# Patient Record
Sex: Female | Born: 1955 | Race: White | Hispanic: No | Marital: Married
Health system: Southern US, Community
[De-identification: ages and names within clinical notes are randomized; demographics above are authoritative.]

---

## 1997-11-08 ENCOUNTER — Other Ambulatory Visit: Admission: RE | Admit: 1997-11-08 | Discharge: 1997-11-08 | Payer: Self-pay | Admitting: Obstetrics and Gynecology

## 1998-11-19 ENCOUNTER — Other Ambulatory Visit: Admission: RE | Admit: 1998-11-19 | Discharge: 1998-11-19 | Payer: Self-pay | Admitting: Obstetrics and Gynecology

## 1999-05-30 ENCOUNTER — Encounter: Payer: Self-pay | Admitting: Family Medicine

## 1999-05-30 ENCOUNTER — Encounter: Admission: RE | Admit: 1999-05-30 | Discharge: 1999-05-30 | Payer: Self-pay | Admitting: Family Medicine

## 1999-11-26 ENCOUNTER — Other Ambulatory Visit: Admission: RE | Admit: 1999-11-26 | Discharge: 1999-11-26 | Payer: Self-pay | Admitting: Obstetrics and Gynecology

## 2001-01-20 ENCOUNTER — Other Ambulatory Visit: Admission: RE | Admit: 2001-01-20 | Discharge: 2001-01-20 | Payer: Self-pay | Admitting: Obstetrics and Gynecology

## 2002-02-06 ENCOUNTER — Other Ambulatory Visit: Admission: RE | Admit: 2002-02-06 | Discharge: 2002-02-06 | Payer: Self-pay | Admitting: Obstetrics and Gynecology

## 2003-04-05 ENCOUNTER — Other Ambulatory Visit: Admission: RE | Admit: 2003-04-05 | Discharge: 2003-04-05 | Payer: Self-pay | Admitting: Obstetrics and Gynecology

## 2004-07-25 ENCOUNTER — Other Ambulatory Visit: Admission: RE | Admit: 2004-07-25 | Discharge: 2004-07-25 | Payer: Self-pay | Admitting: Obstetrics and Gynecology

## 2007-11-11 ENCOUNTER — Encounter (INDEPENDENT_AMBULATORY_CARE_PROVIDER_SITE_OTHER): Payer: Self-pay | Admitting: Obstetrics and Gynecology

## 2007-11-11 ENCOUNTER — Ambulatory Visit (HOSPITAL_COMMUNITY): Admission: RE | Admit: 2007-11-11 | Discharge: 2007-11-11 | Payer: Self-pay | Admitting: Obstetrics and Gynecology

## 2008-12-20 ENCOUNTER — Encounter: Payer: Self-pay | Admitting: Internal Medicine

## 2009-10-14 ENCOUNTER — Encounter: Payer: Self-pay | Admitting: Internal Medicine

## 2009-10-17 ENCOUNTER — Ambulatory Visit: Payer: Self-pay | Admitting: Cardiology

## 2009-10-30 ENCOUNTER — Encounter: Payer: Self-pay | Admitting: Internal Medicine

## 2010-03-06 ENCOUNTER — Encounter: Payer: Self-pay | Admitting: Internal Medicine

## 2010-03-21 ENCOUNTER — Institutional Professional Consult (permissible substitution) (INDEPENDENT_AMBULATORY_CARE_PROVIDER_SITE_OTHER): Payer: PRIVATE HEALTH INSURANCE | Admitting: Internal Medicine

## 2010-03-21 ENCOUNTER — Encounter: Payer: Self-pay | Admitting: Internal Medicine

## 2010-03-21 DIAGNOSIS — D869 Sarcoidosis, unspecified: Secondary | ICD-10-CM | POA: Insufficient documentation

## 2010-03-23 DIAGNOSIS — E039 Hypothyroidism, unspecified: Secondary | ICD-10-CM | POA: Insufficient documentation

## 2010-04-01 NOTE — Assessment & Plan Note (Signed)
Summary: ? sarcodosis/per dr Theron Arista jordan/cb   Copy to:  Amy Swaziland, MD/ Derm Primary Provider/Referring Provider:  Doristine Counter  CC:  Pulmonary consult-? sarcoidosis; Amy Swaziland.Marland Kitchen  History of Present Illness: March 21, 2010- 55 yoF seen in pulmonary referral by Dr Swaziland concerned about possibility of systemic sarcoid. Mrs. Riera has been in good health. in the last year she has had some small periorbital skin nodules. A lesion bx'd from the side of her nose 10/14/09 was read as granulomatous dermatitis, compatible with sarcoid. Special stains were negative for organisms. another from the left eyelid on 12/21/08. CXR 10/30/09 was negative. ACE level 10/31/09- WNL, 25 (25-73). She notes occasional night sweats she can't distinguish from hormonal, but denies cough, fever, nodes, other rash or weight loss. There has been some mild symmetrical stiffness in her hands, but no heat or erythema. There is no prior hx of lung or liver disease, other than a minor AST elevation .Marland Kitchen She has had some travel in  [place] but not in the [employer] area.  No known TB exposure- PPD was negative years ago.   Preventive Screening-Counseling & Management  Alcohol-Tobacco     Smoking Status: never     Passive Smoke Exposure: yes  Current Medications (verified): 1)  Metoprolol Tartrate 50 Mg Tabs (Metoprolol Tartrate) .... Take 1 By Mouth Once Daily 2)  Levothroid 50 Mcg Tabs (Levothyroxine Sodium) .... Take 1 By Mouth Once Daily 3)  Caltrate 600+d 600-400 Mg-Unit Tabs (Calcium Carbonate-Vitamin D) .... Take 1 By Mouth Two Times A Day 4)  Triflex Sport .... Take 1 By Mouth Once Daily  Allergies (verified): No Known Drug Allergies  Past History:  Past Medical History: Granulomatous dermatitis c/w sarcoid 2011 Hypothyroidism PVCs/ metoprolol  Past Surgical History: GYN- LEEP procedure, D&C  Family History: Mother-asthma-, PHX smoking Father-Heart Disease. Liver cancer(HCC)-LIVING EX  SMOKER Brother-MS-DECEASED  Social History: Married;children, homemaker non smoker ETOH-social weekend use. Positive history of passive tobacco smoke exposure.  Smoking Status:  never Passive Smoke Exposure:  yes  Review of Systems      See HPI       The patient complains of sore throat.  The patient denies shortness of breath with activity, shortness of breath at rest, productive cough, non-productive cough, coughing up blood, chest pain, irregular heartbeats, acid heartburn, indigestion, loss of appetite, weight change, abdominal pain, difficulty swallowing, tooth/dental problems, headaches, nasal congestion/difficulty breathing through nose, sneezing, itching, ear ache, anxiety, depression, hand/feet swelling, joint stiffness or pain, rash, change in color of mucus, and fever.    Vital Signs:  Patient profile:   55 year old female Height:      63 inches Weight:      133.38 pounds BMI:     23.71 O2 Sat:      100 % on Room air Pulse rate:   57 / minute BP sitting:   138 / 86  (right arm) Cuff size:   regular  Vitals Entered By: Reynaldo Minium CMA (March 21, 2010 1:59 PM)  O2 Flow:  Room air CC: Pulmonary consult-? sarcoidosis; Amy Swaziland.   Physical Exam  Additional Exam:  General: A/Ox3; pleasant and cooperative, NAD, wdwn, comfortable -appearing SKIN: no rash, lesions. Minor cutaneous nodules on her eyelids had to be pointed out to me.  NODES: no lymphadenopathy HEENT: [place]/AT, EOM- WNL, Conjuctivae- clear, PERRLA, TM-WNL, Nose- turbinate edema, Throat- clear and wnl, Mallampati  III NECK: Supple w/ fair ROM, JVD- none, normal carotid impulses w/o  bruits Thyroid- normal to palpation CHEST: Clear to P&A HEART: RRR, no m/g/r heard ABDOMEN: Soft and nl; nml bowel sounds; no organomegaly or masses noted ZOX:WRUE, nl pulses, no edema  NEURO: Grossly intact to observation      Impression & Recommendations:  Problem # 1:  SARCOIDOSIS (ICD-135) Question of cutaneous  sarcoid based on bx. Sero negative. She asks if gyn bx from previous procedure would prove  to be the same process. She has no symptoms. I will suggest that Dr Swaziland might coordinate pathology comparison of her skin and gyn biopsies. Dr Doristine Counter might choose to repeat a CXR and ACE once in a year. Otherwise no intervention is indicated.   Medications Added to Medication List This Visit: 1)  Metoprolol Tartrate 50 Mg Tabs (Metoprolol tartrate) .... Take 1 by mouth once daily 2)  Levothroid 50 Mcg Tabs (Levothyroxine sodium) .... Take 1 by mouth once daily 3)  Caltrate 600+d 600-400 Mg-unit Tabs (Calcium carbonate-vitamin d) .... Take 1 by mouth two times a day 4)  Triflex Sport  .... Take 1 by mouth once daily  Other Orders: Consultation Level III (45409)  Patient Instructions: 1)  Please schedule a follow-up appointment as needed. 2)  cc Dr Swaziland, Dr Doristine Counter

## 2010-04-01 NOTE — Letter (Signed)
Summary: [facility]   Imported By: Sherian Rein 03/26/2010 13:54:17  _____________________________________________________________________  External Attachment:    Type:   Image     Comment:   External Document

## 2010-04-29 ENCOUNTER — Other Ambulatory Visit: Payer: Self-pay | Admitting: Cardiology

## 2010-04-29 DIAGNOSIS — E039 Hypothyroidism, unspecified: Secondary | ICD-10-CM

## 2010-05-16 ENCOUNTER — Other Ambulatory Visit: Payer: Self-pay | Admitting: *Deleted

## 2010-05-16 DIAGNOSIS — E039 Hypothyroidism, unspecified: Secondary | ICD-10-CM

## 2010-05-16 MED ORDER — LEVOTHYROXINE SODIUM 50 MCG PO TABS: 50.0000 ug | ORAL_TABLET | Freq: Every day | ORAL | Status: DC

## 2010-06-03 NOTE — Op Note (Signed)
[name], Jody Sullivan               ACCOUNT NO.:  [id number]   MEDICAL RECORD NO.:  [id number]          PATIENT TYPE:  AMB   LOCATION:  SDC                           FACILITY:  [facility]   PHYSICIAN:  Juluis Mire, M.D.   DATE OF BIRTH:  [birth year]   DATE OF PROCEDURE:  11/11/2007  DATE OF DISCHARGE:                               OPERATIVE REPORT   PREOPERATIVE DIAGNOSES:  1. Endometrial polyp.  2. Cervical dysplasia.   POSTOPERATIVE DIAGNOSES:  1. Endometrial polyp.  2. Cervical dysplasia.   PROCEDURES:  Hysteroscopy with resection of submucosal fibroid.  Endometrial curettings.  Loop electrical excision procedure of the  cervix.   SURGEON:  Juluis Mire, MD   ANESTHESIA:  General.   ESTIMATED BLOOD LOSS:  Minimal.   PACKS AND DRAINS:  None.   INTRAOPERATIVE BLOOD PLACED:  None.   COMPLICATIONS:  None.   INDICATIONS:  Are as dictated in the history and physical.   PROCEDURE:  The patient was taken to the OR and placed in supine  position.  After satisfactory level of general anesthesia was obtained,  the patient was placed in the dorsal lithotomy position using Allen  stirrups.  Perineum and vagina prepped out with Betadine and draped in  sterile field.  Speculum was placed in the vaginal vault.  Cervix was  grasped with single-tooth tenaculum.  Paracervical block was instituted  using 1% Nesacaine.  Uterus sounded to 8 cm.  Cervix was serially  dilated to a size 29 Pratt dilator.  Hysteroscope was introduced,  uterine cavity revealed a submucosal fibroid in the anterior wall.  This  was resected completely and sent for pathology.  We were able to resect  almost all of it.  Total deficit was 45 mL.  There was no active  bleeding or signs of perforation.  We then did endometrial curettings  and these were sent for pathology.  At this point in time, the single-  tooth tenaculum and speculum were then taken out.  The LEEP speculum was  put in place.  We then injected the  cervix with 1% Xylocaine with  epinephrine.  We did a wide, but shallow loop excision and this was sent  to pathology.  We then cauterized the base and applied Monsel's.  We had  good hemostasis and no active bleeding.  At this point in time,  the speculum was taken out.  The patient was taken out of the dorsal  lithotomy position and once alert and extubated, transferred to recovery  room in good condition.  Sponge, instruments, and needle count was  reported correct by circulating nurse x2.      Juluis Mire, M.D.  Electronically Signed     JSM/MEDQ  D:  11/11/2007  T:  11/12/2007  Job:  [id number]

## 2010-10-21 LAB — HCG, SERUM, QUALITATIVE: Preg, Serum: NEGATIVE

## 2010-10-21 LAB — CBC
Hemoglobin: 13.5
RBC: 4.14

## 2010-11-29 ENCOUNTER — Other Ambulatory Visit: Payer: Self-pay | Admitting: Cardiology

## 2011-05-04 ENCOUNTER — Other Ambulatory Visit: Payer: Self-pay | Admitting: Specialist

## 2011-05-04 ENCOUNTER — Ambulatory Visit
Admission: RE | Admit: 2011-05-04 | Discharge: 2011-05-04 | Disposition: A | Discharge status: Home / Self Care | Payer: PRIVATE HEALTH INSURANCE | Source: Ambulatory Visit | Attending: Specialist | Admitting: Specialist

## 2011-05-04 DIAGNOSIS — R52 Pain, unspecified: Secondary | ICD-10-CM

## 2011-05-04 DIAGNOSIS — M25519 Pain in unspecified shoulder: Secondary | ICD-10-CM

## 2011-12-27 ENCOUNTER — Other Ambulatory Visit: Payer: Self-pay | Admitting: Nurse Practitioner

## 2013-02-22 ENCOUNTER — Encounter: Payer: Self-pay | Admitting: Cardiology

## 2013-11-02 ENCOUNTER — Other Ambulatory Visit: Payer: Self-pay | Admitting: Obstetrics and Gynecology

## 2013-11-03 LAB — CYTOLOGY - PAP

## 2017-05-11 ENCOUNTER — Other Ambulatory Visit: Payer: Self-pay | Admitting: Family Medicine

## 2017-05-11 ENCOUNTER — Ambulatory Visit
Admission: RE | Admit: 2017-05-11 | Discharge: 2017-05-11 | Disposition: A | Discharge status: Home / Self Care | Payer: Commercial Managed Care - PPO | Source: Ambulatory Visit | Attending: Family Medicine | Admitting: Family Medicine

## 2017-05-11 DIAGNOSIS — R0602 Shortness of breath: Secondary | ICD-10-CM

## 2018-02-28 ENCOUNTER — Ambulatory Visit: Payer: Commercial Managed Care - PPO | Admitting: Sports Medicine

## 2018-02-28 VITALS — BP 132/80 | Ht 63.0 in | Wt 130.0 lb

## 2018-02-28 DIAGNOSIS — M940 Chondrocostal junction syndrome [Tietze]: Secondary | ICD-10-CM | POA: Diagnosis not present

## 2018-02-28 DIAGNOSIS — S161XXA Strain of muscle, fascia and tendon at neck level, initial encounter: Secondary | ICD-10-CM

## 2018-02-28 NOTE — Progress Notes (Signed)
  Jody Sullivan - 63 y.o. female MRN [id number]  Date of birth: 07-May-1955  SUBJECTIVE:  Including CC & ROS.  Jody Sullivan is a 63 year old female presenting for chronic rib pain and shoulder pain.  She was referred by her PT, [place] who she sees for dry needling.  Rib pain: Chronic over the last year.  Had pneumonia back in April which exacerbated her rib pain which has improved.  She continues to have a soreness sensation throughout her rib cage including under her breasts bilaterally and near her thoracic spine with a 1/10 severity.  Pain is exacerbated with movement and not worse with exertion or improved with rest.  She occasionally takes Advil with some improvement.  She was told she had costochondritis last year following her pneumonia.  She denies chest pain or shortness of breath.  Shoulder pain: Patient reports burning sensation localized to bilateral shoulders which does not radiate to upper extremities.  She felt her pain was exacerbated following massage last week where she also receives dry needling.  She denies weakness to her upper extremities or change in sensation.  She has no difficulty with cervical rotation.  She is reported increased stress in her life with the passing of her mother back in September after caring for her over the last 3 years.  She also gave up Pilates and tennis but is hopeful to be active again.  Patient has been hesitant to increase activity level due to uncertainty regarding source of pain.  She does have some improvement with Advil and cold compress.    HISTORY: Past Medical, Surgical, Social, and Family History Reviewed & Updated per EMR.   Pertinent Historical Findings include: History of hypothyroidism.  No surgical history.  DATA REVIEWED: No history of back imaging.  PHYSICAL EXAM:  VS: BP:132/80  HR: bpm  TEMP: ( )  RESP:   HT:5\' 3"  (160 cm)   WT:130 lb (59 kg)  BMI:23.03 PHYSICAL EXAM: General: well nourished, well developed, NAD with non-toxic  appearance HEENT: normocephalic, atraumatic, moist mucous membranes Neck: passive and active ROM intact, negative Spurling test, moderate tenderness localized to trapezius bilaterally Skin: warm, dry, no rashes or lesions, cap refill < 2 seconds Extremities: warm and well perfused, normal tone, no edema MSK: 5/5 motor strength on upper extremities bilaterally, neurovascular intact, no central spine tenderness, mild tenderness to thoracic region and diffusely throughout rib cage  ASSESSMENT & PLAN: See problem based charting & AVS for pt instructions.  Costochondritis Chronic.  Has history of work-up for sarcoidosis and history of pneumonia.  Not consistent with ACS.  Given multiple locations and exacerbation with movement only, symptoms do sound consistent with costochondritis. - Ambulatory referral to PT with Jen placed - Discussed importance of physical exercise and other conservative management - Reviewed return precautions, RTC as needed  Cervical strain Chronic.  Bilateral involvement.  No signs of nerve impingement.  Likely secondary to poor ergonomics and increased stress. - Discussed importance of exercise and other means of conservative therapy - Patient to continue seeing Karin Golden for dry needling and massage - See plan for costochondritis

## 2018-02-28 NOTE — Assessment & Plan Note (Signed)
Chronic.  Bilateral involvement.  No signs of nerve impingement.  Likely secondary to poor ergonomics and increased stress. - Discussed importance of exercise and other means of conservative therapy - Patient to continue seeing Karin Golden for dry needling and massage - See plan for costochondritis

## 2018-02-28 NOTE — Patient Instructions (Signed)
Thank you for coming in to see [place] today. Please see below to review our plan for today's visit.  I do believe you have 2 problems going on.  The first regarding your ribs is secondary to costochondritis which is a chronic inflammatory issue which causes pain and usually occurs in flares.  We will get you set up with Candise Bowens to begin physical therapy and Pilates.  I encourage you to try Advil 500 mg twice daily as needed for severe exacerbations, otherwise remaining active will be your best friend.  You also appear to be having trouble with trapezius strain which is a muscle body on your shoulders and extends to your neck, upper back, and back of scalp.  This is a common area to carry stress and usually is exacerbated with poor posture.  This is another reason for you to get active again.  I would encourage you to pick up tennis and find other means to be active.  The Advil which I recommended for your costochondritis will also help with this.  Please follow-up with [place] if your symptoms are worsening.  Durward Parcel, DO [facility], PGY-3

## 2018-02-28 NOTE — Assessment & Plan Note (Signed)
Chronic.  Has history of work-up for sarcoidosis and history of pneumonia.  Not consistent with ACS.  Given multiple locations and exacerbation with movement only, symptoms do sound consistent with costochondritis. - Ambulatory referral to PT with Jen placed - Discussed importance of physical exercise and other conservative management - Reviewed return precautions, RTC as needed

## 2018-03-03 ENCOUNTER — Other Ambulatory Visit: Payer: Self-pay

## 2018-03-03 ENCOUNTER — Ambulatory Visit: Payer: Commercial Managed Care - PPO | Attending: Sports Medicine | Admitting: Physical Therapy

## 2018-03-03 ENCOUNTER — Encounter: Payer: Self-pay | Admitting: Physical Therapy

## 2018-03-03 DIAGNOSIS — R252 Cramp and spasm: Secondary | ICD-10-CM | POA: Diagnosis present

## 2018-03-03 DIAGNOSIS — M542 Cervicalgia: Secondary | ICD-10-CM | POA: Diagnosis not present

## 2018-03-03 NOTE — Therapy (Signed)
[facility] [address] [place], [place], [address] Phone: [phone]   Fax:  [phone]  Physical Therapy Evaluation  Patient Details  Name: Jody Sullivan MRN: [id number] Date of Birth: Mar 27, 1955 Referring Provider (PT): Dr  Reino Bellis    Encounter Date: 03/03/2018  PT End of Session - 03/03/18 1316    Visit Number  1    Number of Visits  12    PT Start Time  1320    PT Stop Time  1410    PT Time Calculation (min)  50 min    Activity Tolerance  Patient tolerated treatment well    Behavior During Therapy  [facility] for tasks assessed/performed       History reviewed. No pertinent past medical history.  History reviewed. No pertinent surgical history.  There were no vitals filed for this visit.   Subjective Assessment - 03/03/18 1315    Subjective  Patient reports with midback pain which has been ongoing since April 2019.  She had pneumonia and costochondritis.  She had previously been active but took 3 yrs off from her normal exercises and she thought that may have been part of it.  She has seen a PT on and off ([facility]) through the years.   She has had increase in posterior neck pain about 1 month ago.  She has burning in her upper back/neck  and difficulty lifting, reaching out.  She denies trauma or an incident.  It has been more painful since a treatment end of Jan.. Overall the pain is better but she would like to get rid of it once and for all.      Pertinent History  pneumonia, costochondritis, CTS surgery L hand.  Recent loss of parent.     Limitations  Reading;Sitting;Lifting;House hold activities   overly cautious   Diagnostic tests  none     Patient Stated Goals  to get rid of pain, I would to not be sore at all     Currently in Pain?  Yes    Pain Score  1    today is a good day, was 6/10 last week)    Pain Location  Neck    Pain Orientation  Right;Left;Posterior    Pain Descriptors / Indicators   Burning;Tightness    Pain Type  Chronic pain;Acute pain   acute on chronic    Pain Radiating Towards  across bilateral shoulders     Pain Onset  More than a month ago    Pain Frequency  Intermittent    Aggravating Factors   stress, tension , overactivity, to a degree deep, painful massage has been a trigger     Pain Relieving Factors  ice, heat, meds OTC    Effect of Pain on Daily Activities  feels creaky, limits her comfort     Multiple Pain Sites  No         [facility] PT Assessment - 03/03/18 0001      Assessment   Medical Diagnosis  cervical strain     Referring Provider (PT)  Dr  Reino Bellis     Onset Date/Surgical Date  --   acute    Hand Dominance  Right    Prior Therapy  Yes       Precautions   Precautions  None      Restrictions   Weight Bearing Restrictions  No      Balance Screen   Has the patient fallen in the past  6 months  No      Home Public house managernvironment   Living Environment  Private residence    Living Arrangements  Spouse/significant other      Prior Function   Level of Independence  Independent    Vocation  Retired    GafferVocation Requirements  has not worked, was a Corporate treasurerCPA long ago     Leisure  walk for fitness, nature, relaxing, music, being with family       Cognition   Overall Cognitive Status  Within Functional Limits for tasks assessed      Observation/Other Assessments   Focus on Therapeutic Outcomes (FOTO)   39%      Sensation   Light Touch  Appears Intact    Additional Comments  rare numbness L hand      Coordination   Gross Motor Movements are Fluid and Coordinated  Not tested      Posture/Postural Control   Posture/Postural Control  Postural limitations    Postural Limitations  Rounded Shoulders;Forward head;Decreased thoracic kyphosis    Posture Comments  mild FWHP, low Rt shoulder      AROM   Overall AROM Comments  UE WFL     Cervical Flexion  50    Cervical Extension  70    Cervical - Right Side Bend  50    Cervical - Left Side Bend  55     Cervical - Right Rotation  WFL    Cervical - Left Rotation  [facility]       Strength   Overall Strength Comments  UE shoulder flexion and abd 4+/5       Palpation   Palpation comment  pain along bilateral suboccipitals, sore in bilateral upper trap, levator scapula and mid back /rhomboids, extending into Thoracolumbar area       Transfers   Comments  WNL       Ambulation/Gait   Gait Comments  WNL                 Objective measurements completed on examination: See above findings.      [facility] Treatment/Exercise - 03/03/18 0001      Self-Care   Self-Care  Posture;Heat/Ice Application;Other Self-Care Comments    Posture  FW head posture    Other Self-Care Comments   HEP, relaxation and tension, eval findings, POC       Neck Exercises: Standing   Neck Retraction  10 reps      Lumbar Exercises: Quadruped   Madcat/Old Horse  5 reps    Madcat/Old Horse Limitations  then "wags" for sidebending     Other Quadruped Lumbar Exercises  Mermaid x 2              PT Education - 03/03/18 1917    Education Details  PT/POC , HEP, posture, stabilization, mobility of spine     Person(s) Educated  Patient    Methods  Explanation;Demonstration;Handout    Comprehension  Verbalized understanding;Returned demonstration          PT Long Term Goals - 03/03/18 1918      PT LONG TERM GOAL #1   Title  Pt will be able to improve FOTO score by 10%     Baseline  39%    Time  6    Period  Weeks    Status  New    Target Date  04/14/18      PT LONG TERM GOAL #2   Title  Pt will  be I with HEP upon discharge for posture and reducing cervical tension    Time  6    Period  Weeks    Status  New    Target Date  04/14/18      PT LONG TERM GOAL #3   Title  Pt will be able to complete ADLs, housework without increasing neck pain, including lifting moderate sized items     Time  6    Period  Weeks    Status  New    Target Date  04/14/18      PT LONG TERM GOAL #4   Title   Pt will identify techniques for improving relaxation and stress management    Time  6    Period  Weeks    Status  New    Target Date  04/14/18      PT LONG TERM GOAL #5   Title  Pt will be able to move with ease, less apprehension about transitional movements and head turns.     Time  6    Period  Weeks    Status  New    Target Date  04/14/18      Additional Long Term Goals   Additional Long Term Goals  Yes      PT LONG TERM GOAL #6   Title  Pt will transition to structured fitness program without increasing neck pain.     Time  6    Period  Weeks    Status  New    Target Date  04/14/18             Plan - 03/03/18 1919    Clinical Impression Statement  Patient presents for mod complexity eval of acute on chronic neck pain.  Her symptoms are consistent with upper crossed syndrome, strain induced by stress an prolonged periods of sitting, driving in the car.  She is not interested in continuing dry needling right now as she feels it may have aggravated her neck pain about 2 weeks ago.  She should do very well and be able to transition to community fitness with resolution of symptoms.     History and Personal Factors relevant to plan of care:  back pain chronic, loss of parent(s) recently, anxiety and fear related to increasing pain and limitations of activity     Clinical Presentation  Evolving    Clinical Presentation due to:  changing symptoms, variable in location and intensity     Clinical Decision Making  Moderate    Rehab Potential  Excellent    PT Frequency  2x / week    PT Duration  6 weeks    PT Treatment/Interventions  ADLs/Self Care Home Management;Moist Heat;Therapeutic activities;Therapeutic exercise;Ultrasound;Manual techniques;Taping;Neuromuscular re-education;Cryotherapy;Electrical Stimulation;Functional mobility training;Patient/family education;Passive range of motion    PT Next Visit Plan  check HEP, manual to neck, upper back, try foam roller, eventually  inroduce to Reformer, include rotation for thoracic spine    PT Home Exercise Plan  cat/camel, wags, upper trap stretch and chin tuck.     Consulted and Agree with Plan of Care  Patient       Patient will benefit from skilled therapeutic intervention in order to improve the following deficits and impairments:  Hypomobility, Impaired sensation, Decreased strength, Increased fascial restricitons, Impaired UE functional use, Pain, Increased muscle spasms, Postural dysfunction, Decreased range of motion  Visit Diagnosis: Cervicalgia - Plan: PT plan of care cert/re-cert  Cramp and spasm - Plan: PT plan of  care cert/re-cert     Problem List Patient Active Problem List   Diagnosis Date Noted  . Costochondritis 02/28/2018  . Cervical strain 02/28/2018  . HYPOTHYROIDISM 03/23/2010    PAA,JENNIFER 03/03/2018, 7:35 PM  [facility] [address] [place], [place], [address] Phone: [phone]   Fax:  [phone]  Name: Charnel Wolanin MRN: [id number] Date of Birth: 1955/08/02    Karie Mainland, PT 03/03/18 7:35 PM Phone: [phone] Fax: [phone]

## 2018-03-03 NOTE — Patient Instructions (Signed)
Step 1  Step 2  Cat-Camel reps: 10  sets: 1  hold: 10  daily: 1  weekly: 7 Setup  Begin on all fours with your arms directly under your shoulders and knees bent 90 degrees. Movement  Slowly round your back up toward the ceiling, then let it sag down to the floor while looking up, and repeat.  Tip  Make sure to use your entire back for the motion and keep your movements slow and controlled. Step 1  Step 2  Step 3  Step 4  Mermaid reps: 10  sets: 2  hold: 10  daily: 1  weekly: 7 Setup  Begin sitting upright with your legs bent, one out to your side and one in front of you. Movement  Inhale as you lift your arm overhead, exhale as you sidebend into a long C-shape position, sweeping your top arm overhead. Inhale as you return to an upright seated position and exhale as you lower your hand down, and repeat to the opposite side. Repeat the exercise a second time with your legs facing the other direction. Tip  Make sure to keep your abdominals engaged and movements fluid. Do not lean your trunk forward or backward as you sidebend. You can sit on a block or towel as needed. Step 1  Step 2  Supine Chin Tuck reps: 10  sets: 1  hold: 5  daily: 1  weekly: 7 Setup  Begin lying on your back with your neck relaxed. Movement  Gently tuck your chin directly backward as if you are making a double chin. Hold, then relax and repeat. Tip  Make sure not to lift your head from the ground.

## 2018-03-09 ENCOUNTER — Ambulatory Visit: Payer: Commercial Managed Care - PPO | Admitting: Physical Therapy

## 2018-03-09 ENCOUNTER — Encounter: Payer: Self-pay | Admitting: Physical Therapy

## 2018-03-09 DIAGNOSIS — M542 Cervicalgia: Secondary | ICD-10-CM

## 2018-03-09 DIAGNOSIS — R252 Cramp and spasm: Secondary | ICD-10-CM

## 2018-03-09 NOTE — Therapy (Signed)
[facility] Outpatient Rehabilitation [facility] [address] [place], [place], [address] Phone: [phone]   Fax:  [phone]  Physical Therapy Treatment  Patient Details  Name: Jody Sullivan MRN: [id number] Date of Birth: Jul 09, 1955 Referring Provider (PT): Dr  Reino Bellis    Encounter Date: 03/09/2018  PT End of Session - 03/09/18 1321    Visit Number  2    Number of Visits  12    PT Start Time  1102    PT Stop Time  1145    PT Time Calculation (min)  43 min    Activity Tolerance  Patient tolerated treatment well    Behavior During Therapy  [facility] for tasks assessed/performed       History reviewed. No pertinent past medical history.  History reviewed. No pertinent surgical history.  There were no vitals filed for this visit.  Subjective Assessment - 03/09/18 1103    Subjective  Pain with movement.  No pai at rest.   Exercises helpful.    Currently in Pain?  No/denies    Pain Location  Neck    Pain Orientation  Left    Pain Descriptors / Indicators  Burning;Tightness    Pain Type  Acute pain    Pain Radiating Towards  across shoulders      Aggravating Factors   lifting.  tightnes.      Pain Relieving Factors  ice,  heat,, OTC                       [facility] Adult PT Treatment/Exercise - 03/09/18 0001      Neck Exercises: Standing   Other Standing Exercises  chin tuch 3 X 10 seconds      Neck Exercises: Supine   Other Supine Exercise  decompression 2 minutes  shoulder press,  head press,  leg lengthener,  leg press 5 x 5 seconds each   these feel good cues.       Manual Therapy   Manual therapy comments  soft tissue work upper back periscalpular and neck in prone.  Tissue softened,  teres tender Left.        Neck Exercises: Stretches   Upper Trapezius Stretch  3 reps;10 seconds    Levator Stretch  Right;Left    Levator Stretch Limitations  standing    Corner Stretch  3 reps;30 seconds    Corner Stretch Limitations  Doorway  Both              PT Education - 03/09/18 1321    Education Details  HEP    Methods  Explanation;Tactile cues;Demonstration;Verbal cues    Comprehension  Returned demonstration;Verbalized understanding          PT Long Term Goals - 03/03/18 1918      PT LONG TERM GOAL #1   Title  Pt will be able to improve FOTO score by 10%     Baseline  39%    Time  6    Period  Weeks    Status  New    Target Date  04/14/18      PT LONG TERM GOAL #2   Title  Pt will be I with HEP upon discharge for posture and reducing cervical tension    Time  6    Period  Weeks    Status  New    Target Date  04/14/18      PT LONG TERM GOAL #3   Title  Pt will  be able to complete ADLs, housework without increasing neck pain, including lifting moderate sized items     Time  6    Period  Weeks    Status  New    Target Date  04/14/18      PT LONG TERM GOAL #4   Title  Pt will identify techniques for improving relaxation and stress management    Time  6    Period  Weeks    Status  New    Target Date  04/14/18      PT LONG TERM GOAL #5   Title  Pt will be able to move with ease, less apprehension about transitional movements and head turns.     Time  6    Period  Weeks    Status  New    Target Date  04/14/18      Additional Long Term Goals   Additional Long Term Goals  Yes      PT LONG TERM GOAL #6   Title  Pt will transition to structured fitness program without increasing neck pain.     Time  6    Period  Weeks    Status  New    Target Date  04/14/18            Plan - 03/09/18 1322    Clinical Impression Statement  Beginning exercises and manual helpful.  LT  periscapulat musculature  softened.  Patrient declined the mneed for mAODLAITES    PT Next Visit Plan  check HEP, manual to neck, upper back, try foam roller, eventually inroduce to Reformer, include rotation for thoracic spine    PT Home Exercise Plan  cat/camel, wags, upper trap stretch and chin tuck. Decompression     Consulted and Agree with Plan of Care  Patient       Patient will benefit from skilled therapeutic intervention in order to improve the following deficits and impairments:     Visit Diagnosis: Cervicalgia  Cramp and spasm     Problem List Patient Active Problem List   Diagnosis Date Noted  . Costochondritis 02/28/2018  . Cervical strain 02/28/2018  . HYPOTHYROIDISM 03/23/2010    [name]  PTA 03/09/2018, 1:25 PM  [facility] [address] [place], [place], [address] Phone: [phone]   Fax:  [phone]  Name: Jody Sullivan MRN: [id number] Date of Birth: 05/24/1955

## 2018-03-09 NOTE — Patient Instructions (Signed)
Decompression issued from exercise drawer All issued PRN 5 x each 5 seconds hold Decompression exercise 5 to 15 minutes

## 2018-03-10 NOTE — Therapy (Signed)
[facility] Outpatient Rehabilitation [facility] [address] [place], [place], [address] Phone: [phone]   Fax:  [phone]  Physical Therapy Treatment  Patient Details  Name: Jody Sullivan MRN: [id number] Date of Birth: [birth year] Referring Provider (PT): Dr  Reino Bellis    Encounter Date: 03/09/2018  PT End of Session - 03/09/18 1321    Visit Number  2    Number of Visits  12    PT Start Time  1102    PT Stop Time  1145    PT Time Calculation (min)  43 min    Activity Tolerance  Patient tolerated treatment well    Behavior During Therapy  [facility] for tasks assessed/performed       History reviewed. No pertinent past medical history.  History reviewed. No pertinent surgical history.  There were no vitals filed for this visit.  Subjective Assessment - 03/09/18 1103    Subjective  Pain with movement.  No pai at rest.   Exercises helpful.    Currently in Pain?  No/denies    Pain Location  Neck    Pain Orientation  Left    Pain Descriptors / Indicators  Burning;Tightness    Pain Type  Acute pain    Pain Radiating Towards  across shoulders      Aggravating Factors   lifting.  tightnes.      Pain Relieving Factors  ice,  heat,, OTC                               PT Education - 03/09/18 1321    Education Details  HEP    Methods  Explanation;Tactile cues;Demonstration;Verbal cues    Comprehension  Returned demonstration;Verbalized understanding          PT Long Term Goals - 03/03/18 1918      PT LONG TERM GOAL #1   Title  Pt will be able to improve FOTO score by 10%     Baseline  39%    Time  6    Period  Weeks    Status  New    Target Date  04/14/18      PT LONG TERM GOAL #2   Title  Pt will be I with HEP upon discharge for posture and reducing cervical tension    Time  6    Period  Weeks    Status  New    Target Date  04/14/18      PT LONG TERM GOAL #3   Title  Pt will be able to complete ADLs, housework  without increasing neck pain, including lifting moderate sized items     Time  6    Period  Weeks    Status  New    Target Date  04/14/18      PT LONG TERM GOAL #4   Title  Pt will identify techniques for improving relaxation and stress management    Time  6    Period  Weeks    Status  New    Target Date  04/14/18      PT LONG TERM GOAL #5   Title  Pt will be able to move with ease, less apprehension about transitional movements and head turns.     Time  6    Period  Weeks    Status  New    Target Date  04/14/18      Additional Long Term Goals  Additional Long Term Goals  Yes      PT LONG TERM GOAL #6   Title  Pt will transition to structured fitness program without increasing neck pain.     Time  6    Period  Weeks    Status  New    Target Date  04/14/18            Plan - 03/09/18 1322    Clinical Impression Statement  Beginning exercises and manual helpful.  LT  periscapulat musculature  softened.  Patrient declined the mneed for mAODLAITES    PT Next Visit Plan  check HEP, manual to neck, upper back, try foam roller, eventually inroduce to Reformer, include rotation for thoracic spine    PT Home Exercise Plan  cat/camel, wags, upper trap stretch and chin tuck. Decompression    Consulted and Agree with Plan of Care  Patient       Patient will benefit from skilled therapeutic intervention in order to improve the following deficits and impairments:     Visit Diagnosis: Cervicalgia  Cramp and spasm     Problem List Patient Active Problem List   Diagnosis Date Noted  . Costochondritis 02/28/2018  . Cervical strain 02/28/2018  . HYPOTHYROIDISM 03/23/2010    HARRIS,KAREN  PTA 03/10/2018, 2:06 PM  [facility] [address] [place], [place], [address] Phone: [phone]   Fax:  [phone]  Name: Jody Sullivan MRN: [id number] Date of Birth: [birth year]

## 2018-03-15 ENCOUNTER — Ambulatory Visit: Payer: Commercial Managed Care - PPO | Admitting: Physical Therapy

## 2018-03-15 DIAGNOSIS — M542 Cervicalgia: Secondary | ICD-10-CM

## 2018-03-15 DIAGNOSIS — R252 Cramp and spasm: Secondary | ICD-10-CM

## 2018-03-15 NOTE — Therapy (Signed)
[facility] Outpatient Rehabilitation [facility] [address] [place], [place], [address] Phone: [phone]   Fax:  [phone]  Physical Therapy Treatment  Patient Details  Name: Jody Sullivan MRN: [id number] Date of Birth: [birth year] Referring Provider (PT): Dr  Reino Bellis    Encounter Date: 03/15/2018  PT End of Session - 03/15/18 1516    Visit Number  3    Number of Visits  12    PT Start Time  1330    PT Stop Time  1419    PT Time Calculation (min)  49 min    Activity Tolerance  Patient tolerated treatment well    Behavior During Therapy  [facility] for tasks assessed/performed       No past medical history on file.  No past surgical history on file.  There were no vitals filed for this visit.  Subjective Assessment - 03/15/18 1336    Subjective  No pain at rest.  When I move it hurts on L side of trunk. I have been hunched over the computer.     Currently in Pain?  Yes    Pain Score  4     Pain Location  Back    Pain Orientation  Left;Mid    Pain Descriptors / Indicators  Sore    Pain Type  Chronic pain    Pain Onset  More than a month ago    Pain Frequency  Intermittent    Aggravating Factors   computer work     Pain Relieving Factors  ice, heat, OTC    Pain Score  4    Pain Location  Neck    Pain Orientation  Right    Pain Descriptors / Indicators  Sharp    Pain Type  Chronic pain    Pain Onset  More than a month ago    Pain Frequency  Intermittent    Aggravating Factors   same as above     Pain Relieving Factors  same as above              [facility] Adult PT Treatment/Exercise - 03/15/18 0001      Pilates   Pilates Reformer  Mermaid 1 red spring added rotation and shoulder work  and extension     Other Pilates  long box prone over head press 1 Red double and single arm       Neck Exercises: Supine   Neck Retraction  10 reps;5 secs    Capital Flexion  10 reps    Cervical Rotation  10 reps    Shoulder Flexion  10 reps    Shoulder  Flexion Weights (lbs)  red band narrow grip pull       Neck Exercises: Stabilization   Stabilization  chin tuck with: diagonal pull x 10 and  horizontal pull red band x 10       Shoulder Exercises: ROM/Strengthening   Other ROM/Strengthening Exercises  upper trunk rotation         Pilates Reformer used for LE/core strength, postural strength, lumbopelvic disassociation and core control.  Exercises included: See above Long box prone overhead press AND swan for core control and spinal articulation  Standing roll down with long box 1 Red x 5 reps    PT Long Term Goals - 03/03/18 1918      PT LONG TERM GOAL #1   Title  Pt will be able to improve FOTO score by 10%     Baseline  39%  Time  6    Period  Weeks    Status  New    Target Date  04/14/18      PT LONG TERM GOAL #2   Title  Pt will be I with HEP upon discharge for posture and reducing cervical tension    Time  6    Period  Weeks    Status  New    Target Date  04/14/18      PT LONG TERM GOAL #3   Title  Pt will be able to complete ADLs, housework without increasing neck pain, including lifting moderate sized items     Time  6    Period  Weeks    Status  New    Target Date  04/14/18      PT LONG TERM GOAL #4   Title  Pt will identify techniques for improving relaxation and stress management    Time  6    Period  Weeks    Status  New    Target Date  04/14/18      PT LONG TERM GOAL #5   Title  Pt will be able to move with ease, less apprehension about transitional movements and head turns.     Time  6    Period  Weeks    Status  New    Target Date  04/14/18      Additional Long Term Goals   Additional Long Term Goals  Yes      PT LONG TERM GOAL #6   Title  Pt will transition to structured fitness program without increasing neck pain.     Time  6    Period  Weeks    Status  New    Target Date  04/14/18            Plan - 03/15/18 1338    Clinical Impression Statement  Patient did well with  postural strengthening exercises, used Pilates for stretching, spinal articulation and felt great.  Spinal mobility improved post session, pain free.     PT Treatment/Interventions  ADLs/Self Care Home Management;Moist Heat;Therapeutic activities;Therapeutic exercise;Ultrasound;Manual techniques;Taping;Neuromuscular re-education;Cryotherapy;Electrical Stimulation;Functional mobility training;Patient/family education;Passive range of motion    PT Next Visit Plan  check HEP, manual to neck, upper back, try foam roller, eventually inroduce to Reformer, include rotation for thoracic spine    PT Home Exercise Plan  cat/camel, wags, upper trap stretch and chin tuck. Decompression. Upper trunk rotation    Consulted and Agree with Plan of Care  Patient       Patient will benefit from skilled therapeutic intervention in order to improve the following deficits and impairments:  Hypomobility, Impaired sensation, Decreased strength, Increased fascial restricitons, Impaired UE functional use, Pain, Increased muscle spasms, Postural dysfunction, Decreased range of motion  Visit Diagnosis: Cervicalgia  Cramp and spasm     Problem List Patient Active Problem List   Diagnosis Date Noted  . Costochondritis 02/28/2018  . Cervical strain 02/28/2018  . HYPOTHYROIDISM 03/23/2010    PAA,JENNIFER 03/15/2018, 3:19 PM  [facility] [address] [place], [place], [address] Phone: [phone]   Fax:  [phone]  Name: Amarise Willow MRN: [id number] Date of Birth: 04-Jun-1955   Karie Mainland, PT 03/15/18 3:20 PM Phone: [phone] Fax: [phone]

## 2018-03-15 NOTE — Patient Instructions (Signed)
Step 1  Step 2  Sidelying Upper Thoracic Rotation reps: 5  sets: 2  hold: 30  daily: 2  weekly: 7 Setup  Begin lying on your side with your knees and hips bent at 90 degree angles, arms straight, and your palms together by your knees. Movement  Lift your top arm up toward the ceiling and diagonally behind you, rotating your upper trunk. Then slowly return to the starting position and repeat. Tip  Make sure to keep your hips and knees stationary as you move your arm.

## 2018-03-17 ENCOUNTER — Encounter: Payer: Commercial Managed Care - PPO | Admitting: Physical Therapy

## 2018-03-22 ENCOUNTER — Ambulatory Visit: Payer: Commercial Managed Care - PPO | Attending: Sports Medicine | Admitting: Physical Therapy

## 2018-03-22 ENCOUNTER — Encounter: Payer: Self-pay | Admitting: Physical Therapy

## 2018-03-22 DIAGNOSIS — M542 Cervicalgia: Secondary | ICD-10-CM | POA: Diagnosis not present

## 2018-03-22 DIAGNOSIS — R252 Cramp and spasm: Secondary | ICD-10-CM | POA: Insufficient documentation

## 2018-03-22 NOTE — Patient Instructions (Signed)
   Step 1  Step 2  Bird Dog reps: 10  sets: 2  hold: 30  daily: 1  weekly: 7 Setup  Begin on all fours, with your arms positioned directly under your shoulders. Movement  Straighten one arm and your opposite leg at the same time, until they are parallel to the floor. Hold briefly, then return to the starting position. Tip  Make sure to keep your abdominals tight and hips level during the exercise.

## 2018-03-22 NOTE — Therapy (Addendum)
[facility] Outpatient Rehabilitation [facility] [address] [place], [place], [address] Phone: [phone]   Fax:  [phone]  Physical Therapy Treatment  Patient Details  Name: Jody Sullivan MRN: [id number] Date of Birth: July 17, 1955 Referring Provider (PT): Dr  Reino Bellis    Encounter Date: 03/22/2018  PT End of Session - 03/22/18 1501    Visit Number  4    Number of Visits  12    PT Start Time  1415    PT Stop Time  1505    PT Time Calculation (min)  50 min    Activity Tolerance  Patient tolerated treatment well    Behavior During Therapy  [facility] for tasks assessed/performed       History reviewed. No pertinent past medical history.  History reviewed. No pertinent surgical history.  There were no vitals filed for this visit.  Subjective Assessment - 03/22/18 1419    Subjective  Pt has been so aware of her posture.  She had some neck pain with reading.      Currently in Pain?  No/denies       Pilates Reformer used for LE/core strength, postural strength, lumbopelvic disassociation and core control.  Exercises included:  Footwork worked on relaxing head, neck and shoulder   Bridging 3 springs x 8, decreased ROM   Supine Arm arcs 1 Red 1 yellow x 10   Cues to relax neck   Hip ext Rt side popping from L spine?   Quadruped 1 blue UE x 10, LE x 10 and then combo x 10   Reverse Abs, UE x 5, then LE x 10, decr spring to yellow for assist (blue was too challenging )       [facility] Adult PT Treatment/Exercise - 03/22/18 0001      Pilates   Pilates Reformer  See note       Lumbar Exercises: Quadruped   Single Arm Raise  5 reps    Straight Leg Raise  5 reps    Opposite Arm/Leg Raise  Right arm/Left leg;Left arm/Right leg;10 reps    Other Quadruped Lumbar Exercises  then bird dog with small lifts UE and LE x 10                   PT Long Term Goals - 03/22/18 1456      PT LONG TERM GOAL #1   Title  Pt will be able to improve FOTO score  by 10%     Status  On-going      PT LONG TERM GOAL #2   Title  Pt will be I with HEP upon discharge for posture and reducing cervical tension    Status  On-going      PT LONG TERM GOAL #3   Title  Pt will be able to complete ADLs, housework without increasing neck pain, including lifting moderate sized items     Status  On-going      PT LONG TERM GOAL #4   Title  Pt will identify techniques for improving relaxation and stress management    Status  On-going      PT LONG TERM GOAL #5   Title  Pt will be able to move with ease, less apprehension about transitional movements and head turns.     Status  Achieved      PT LONG TERM GOAL #6   Title  Pt will transition to structured fitness program without increasing neck pain.  Status  On-going            Plan - 03/22/18 1525    Clinical Impression Statement  Pt without pain during exercises.  She does report increased pain with basic things like reading or riding in the car.  Explained muscle activitiy and how attending to a body part and actively moving it better supports the joints than when she is at rest.  Cues needed (min ) for neck tension during quadruped work. Min tendency to  arch back with hip flexion.     PT Treatment/Interventions  ADLs/Self Care Home Management;Moist Heat;Therapeutic activities;Therapeutic exercise;Ultrasound;Manual techniques;Taping;Neuromuscular re-education;Cryotherapy;Electrical Stimulation;Functional mobility training;Patient/family education;Passive range of motion    PT Next Visit Plan  check HEP, manual to neck, upper back, try foam roller, include rotation for thoracic spine    PT Home Exercise Plan  cat/camel, wags, upper trap stretch and chin tuck. Decompression. Upper trunk rotation, bird dog     Consulted and Agree with Plan of Care  Patient       Patient will benefit from skilled therapeutic intervention in order to improve the following deficits and impairments:  Hypomobility, Impaired  sensation, Decreased strength, Increased fascial restricitons, Impaired UE functional use, Pain, Increased muscle spasms, Postural dysfunction, Decreased range of motion  Visit Diagnosis: Cervicalgia  Cramp and spasm     Problem List Patient Active Problem List   Diagnosis Date Noted  . Costochondritis 02/28/2018  . Cervical strain 02/28/2018  . HYPOTHYROIDISM 03/23/2010    PAA,JENNIFER 03/22/2018, 4:05 PM  [facility] [address] [place], [place], [address] Phone: [phone]   Fax:  [phone]  Name: Jody Sullivan MRN: [id number] Date of Birth: 10-Oct-1955  Karie Mainland, PT 03/22/18 4:05 PM Phone: [phone] Fax: [phone]

## 2018-03-24 ENCOUNTER — Encounter: Payer: Self-pay | Admitting: Physical Therapy

## 2018-03-24 ENCOUNTER — Ambulatory Visit: Payer: Commercial Managed Care - PPO | Admitting: Physical Therapy

## 2018-03-24 DIAGNOSIS — M542 Cervicalgia: Secondary | ICD-10-CM

## 2018-03-24 DIAGNOSIS — R252 Cramp and spasm: Secondary | ICD-10-CM

## 2018-03-24 NOTE — Therapy (Addendum)
[place], [place], [address] Phone: [phone]   Fax:  [phone]  Physical Therapy Treatment/DIscharge   Patient Details  Name: Jody Sullivan MRN: [id number] Date of Birth: 1955-12-04 Referring Provider (PT): Dr  Lilia Argue    Encounter Date: 03/24/2018  PT End of Session - 03/24/18 1332    Visit Number  5    Number of Visits  12    PT Start Time  1331    PT Stop Time  1413    PT Time Calculation (min)  42 min    Activity Tolerance  Patient tolerated treatment well    Behavior During Therapy  [facility] for tasks assessed/performed       History reviewed. No pertinent past medical history.  History reviewed. No pertinent surgical history.  There were no vitals filed for this visit.      [place] Adult PT Treatment/Exercise - 03/24/18 0001      Lumbar Exercises: Supine   Bent Knee Raise  10 reps    Dead Bug  10 reps    Other Supine Lumbar Exercises  foam roller T spine extension   4 levels, added breathing and gentle twist      Lumbar Exercises: Quadruped   Madcat/Old Horse  10 reps    Plank  3 ways x 30 sec: elbow, full plank and monster (quad) plank       Shoulder Exercises: Supine   Horizontal ABduction  Strengthening;Both;15 reps    Theraband Level (Shoulder Horizontal ABduction)  Level 3 (Green)    Diagonals  Strengthening    Theraband Level (Shoulder Diagonals)  Level 3 (Green)    Other Supine Exercises  narrow grip x 15 green band on foam roller     Other Supine Exercises  supine clam on foam roller, dead bug x 10 for core stability       Shoulder Exercises: Prone   Retraction  Strengthening;Both;10 reps    Retraction Weight (lbs)  foam roller arms extended     Other Prone Exercises  thoracic extension swan x 3       Shoulder Exercises: ROM/Strengthening   UBE (Upper Arm Bike)  5 min retro L1     Other ROM/Strengthening Exercises  standing corner stretch x 3, 30 sec                    PT Long Term Goals - 03/22/18 1456      PT LONG TERM GOAL #1   Title  Pt will be able to improve FOTO score by 10%     Status  On-going      PT LONG TERM GOAL #2   Title  Pt will be I with HEP upon discharge for posture and reducing cervical tension    Status  On-going      PT LONG TERM GOAL #3   Title  Pt will be able to complete ADLs, housework without increasing neck pain, including lifting moderate sized items     Status  On-going      PT LONG TERM GOAL #4   Title  Pt will identify techniques for improving relaxation and stress management    Status  On-going      PT LONG TERM GOAL #5   Title  Pt will be able to move with ease, less apprehension about transitional movements and head turns.     Status  Achieved      PT LONG TERM GOAL #  6   Title  Pt will transition to structured fitness program without increasing neck pain.     Status  On-going            Plan - 03/24/18 1442    Clinical Impression Statement  Pt appreciated foam roller exercises for releasing mid and upper back.  Is making progress in a short period of time.  Has not had pain in days, only tension from long periods of sitting.      PT Treatment/Interventions  ADLs/Self Care Home Management;Moist Heat;Therapeutic activities;Therapeutic exercise;Ultrasound;Manual techniques;Taping;Neuromuscular re-education;Cryotherapy;Electrical Stimulation;Functional mobility training;Patient/family education;Passive range of motion    PT Next Visit Plan  will be off for a week, see if she needs more when she returns. Cont core, manual to neck, upper back, try foam roller, include rotation for thoracic spine    PT Home Exercise Plan  cat/camel, wags, upper trap stretch and chin tuck. Decompression. Upper trunk rotation, bird dog     Consulted and Agree with Plan of Care  Patient       Patient will benefit from skilled therapeutic intervention in order to improve the following deficits and  impairments:  Hypomobility, Impaired sensation, Decreased strength, Increased fascial restricitons, Impaired UE functional use, Pain, Increased muscle spasms, Postural dysfunction, Decreased range of motion  Visit Diagnosis: Cervicalgia  Cramp and spasm     Problem List Patient Active Problem List   Diagnosis Date Noted  . Costochondritis 02/28/2018  . Cervical strain 02/28/2018  . HYPOTHYROIDISM 03/23/2010    Jody Sullivan 03/24/2018, 2:48 PM  [facility] [address] [place], [place], [address] Phone: [phone]   Fax:  [phone]  Name: Jody Sullivan MRN: [id number] Date of Birth: 1955-11-12  Raeford Razor, PT 03/24/18 2:48 PM Phone: [phone] Fax: [phone]   PHYSICAL THERAPY DISCHARGE SUMMARY  Visits from Start of Care: 5  Current functional level related to goals / functional outcomes: Unknown, see above    Remaining deficits: UNknown see above    Education / Equipment: HEP Plan: Patient agrees to discharge.  Patient goals were not met. Patient is being discharged due to not returning since the last visit.  ?????    Raeford Razor, PT 09/13/18 9:17 AM Phone: [phone] Fax: [phone]

## 2018-03-29 ENCOUNTER — Encounter: Payer: Commercial Managed Care - PPO | Admitting: Physical Therapy

## 2018-03-31 ENCOUNTER — Encounter: Payer: Commercial Managed Care - PPO | Admitting: Physical Therapy

## 2018-04-05 ENCOUNTER — Ambulatory Visit: Payer: Commercial Managed Care - PPO | Admitting: Physical Therapy

## 2018-09-01 ENCOUNTER — Other Ambulatory Visit: Payer: Self-pay | Admitting: Family Medicine

## 2018-09-01 ENCOUNTER — Ambulatory Visit
Admission: RE | Admit: 2018-09-01 | Discharge: 2018-09-01 | Disposition: A | Discharge status: Home / Self Care | Payer: Commercial Managed Care - PPO | Source: Ambulatory Visit | Attending: Family Medicine | Admitting: Family Medicine

## 2018-09-01 DIAGNOSIS — M542 Cervicalgia: Secondary | ICD-10-CM

## 2018-09-20 ENCOUNTER — Other Ambulatory Visit: Payer: Self-pay | Admitting: Orthopaedic Surgery

## 2018-09-20 DIAGNOSIS — M47892 Other spondylosis, cervical region: Secondary | ICD-10-CM

## 2018-10-08 ENCOUNTER — Ambulatory Visit
Admission: RE | Admit: 2018-10-08 | Discharge: 2018-10-08 | Disposition: A | Discharge status: Home / Self Care | Payer: Commercial Managed Care - PPO | Source: Ambulatory Visit | Attending: Orthopaedic Surgery | Admitting: Orthopaedic Surgery

## 2018-10-08 ENCOUNTER — Other Ambulatory Visit: Payer: Self-pay

## 2018-10-08 DIAGNOSIS — M47892 Other spondylosis, cervical region: Secondary | ICD-10-CM

## 2018-12-12 ENCOUNTER — Other Ambulatory Visit: Payer: Self-pay | Admitting: Family Medicine

## 2018-12-12 DIAGNOSIS — R0789 Other chest pain: Secondary | ICD-10-CM

## 2018-12-23 ENCOUNTER — Ambulatory Visit
Admission: RE | Admit: 2018-12-23 | Discharge: 2018-12-23 | Disposition: A | Discharge status: Home / Self Care | Payer: Commercial Managed Care - PPO | Source: Ambulatory Visit | Attending: Family Medicine | Admitting: Family Medicine

## 2018-12-23 DIAGNOSIS — R0789 Other chest pain: Secondary | ICD-10-CM

## 2020-01-11 ENCOUNTER — Other Ambulatory Visit: Payer: Self-pay | Admitting: Obstetrics and Gynecology

## 2020-01-11 DIAGNOSIS — R928 Other abnormal and inconclusive findings on diagnostic imaging of breast: Secondary | ICD-10-CM

## 2020-01-30 ENCOUNTER — Ambulatory Visit
Admission: RE | Admit: 2020-01-30 | Discharge: 2020-01-30 | Disposition: A | Discharge status: Home / Self Care | Payer: Commercial Managed Care - PPO | Source: Ambulatory Visit | Attending: Obstetrics and Gynecology | Admitting: Obstetrics and Gynecology

## 2020-01-30 ENCOUNTER — Other Ambulatory Visit: Payer: Self-pay

## 2020-01-30 ENCOUNTER — Ambulatory Visit: Payer: Commercial Managed Care - PPO

## 2020-01-30 DIAGNOSIS — R928 Other abnormal and inconclusive findings on diagnostic imaging of breast: Secondary | ICD-10-CM

## 2020-03-06 IMAGING — CT CT CHEST W/O CM
2 of 4 series · 11 of 36 positions shown, 13 images · non-contrast
Comparison: None.

CLINICAL DATA: Chest wall pain

EXAM:
CT CHEST WITHOUT CONTRAST
TECHNIQUE: Multidetector CT imaging of the chest was performed following the
standard protocol without IV contrast.

[Series 2: chest 2.00 br40 s3 · axial · 0.43mm/px · z∈[+1388,+1668]mm · 8 of 166 slices shown, 10 images (1 of 2)]
[im 13/166  mediastinal]
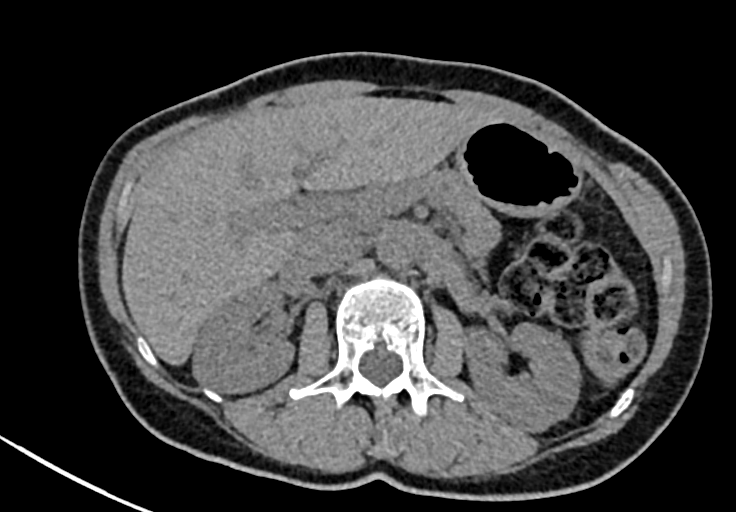
[im 13/166  lung]
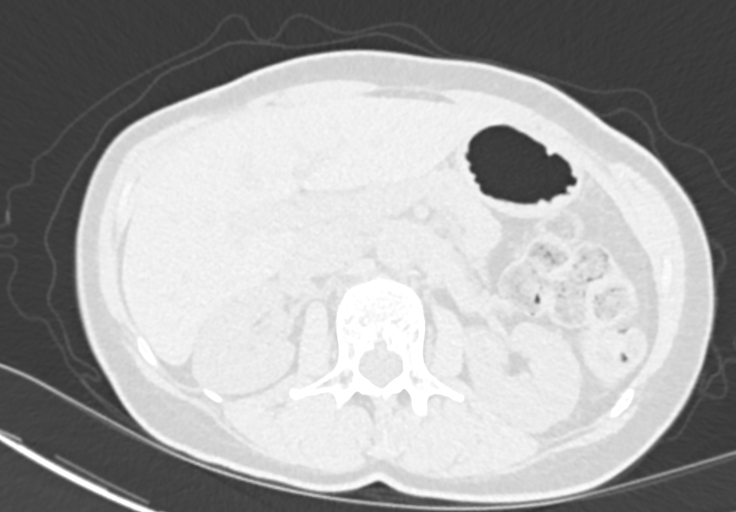
[im 39/166  lung]
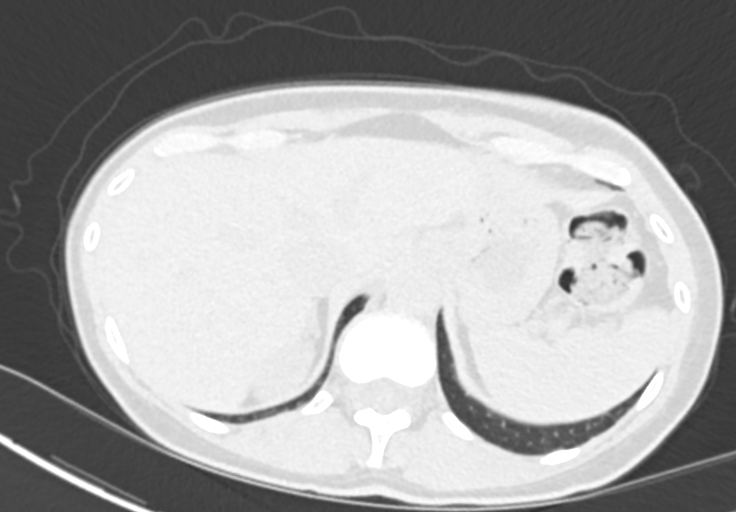
[im 51/166  lung]
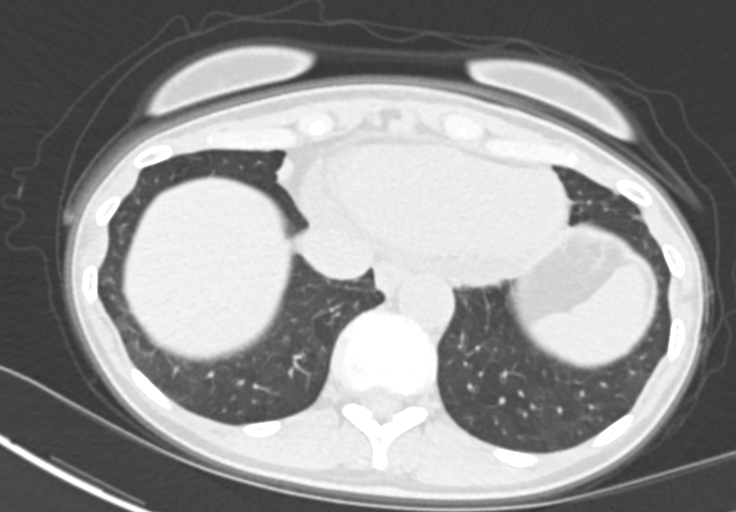
[im 77/166  lung]
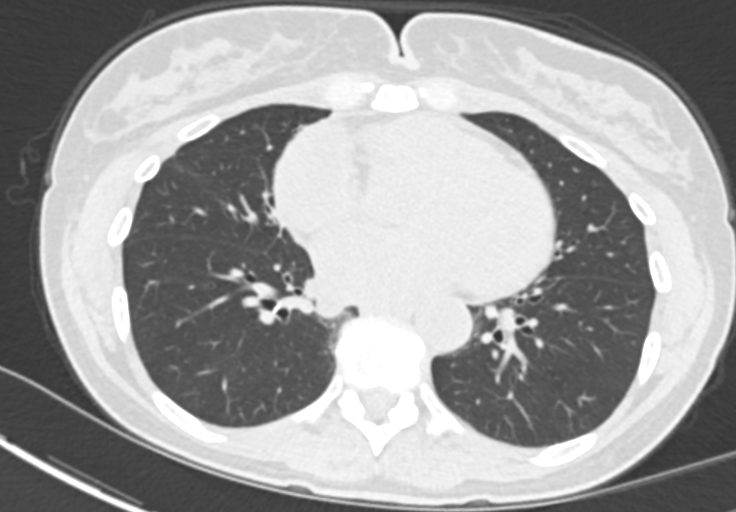
[im 89/166  mediastinal]
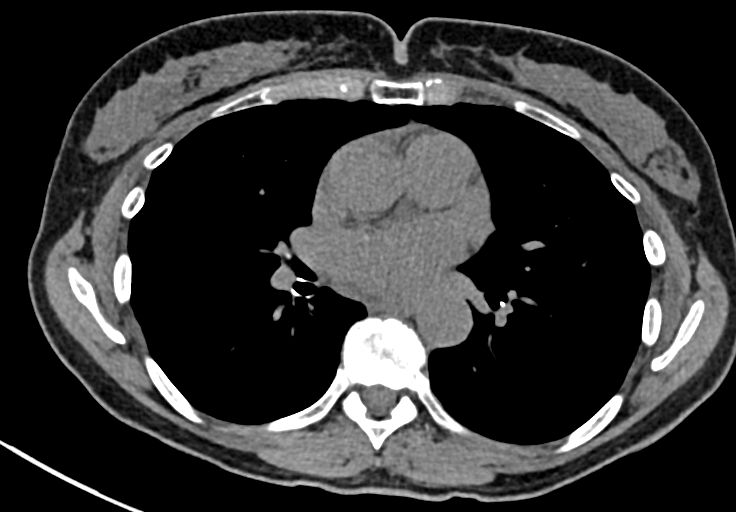
[im 89/166  lung]
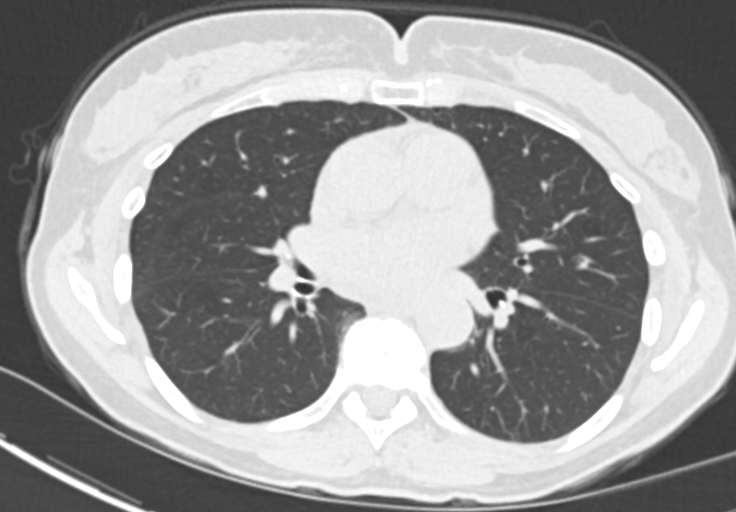
[im 115/166  lung]
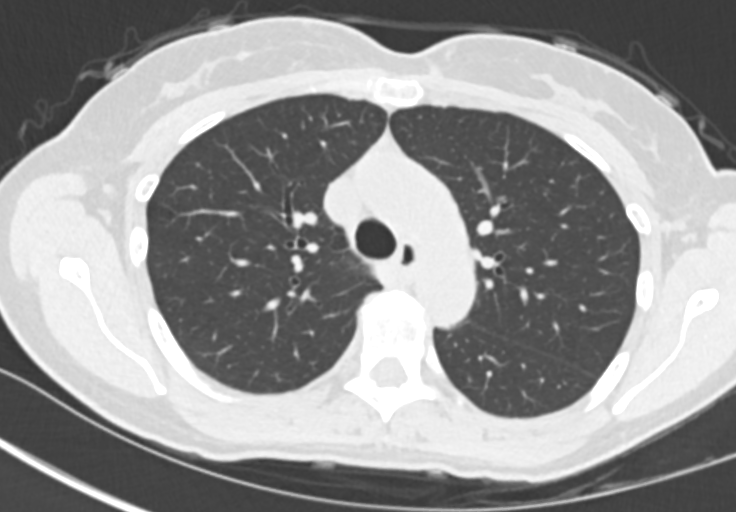
[im 127/166  lung]
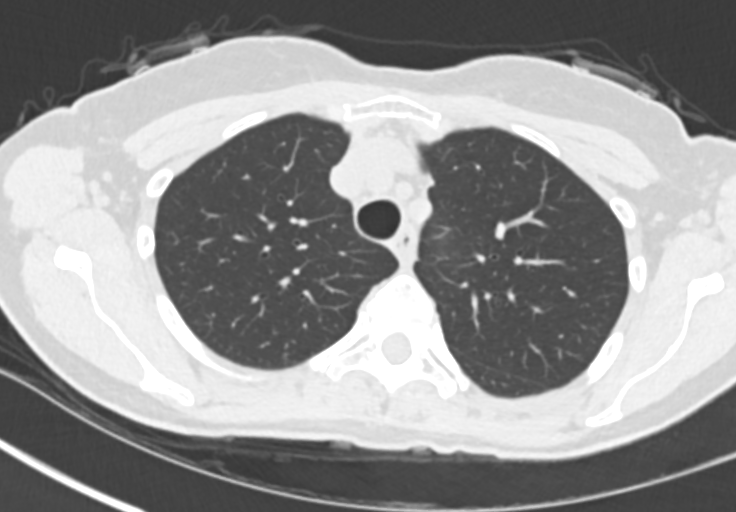
[im 153/166  lung]
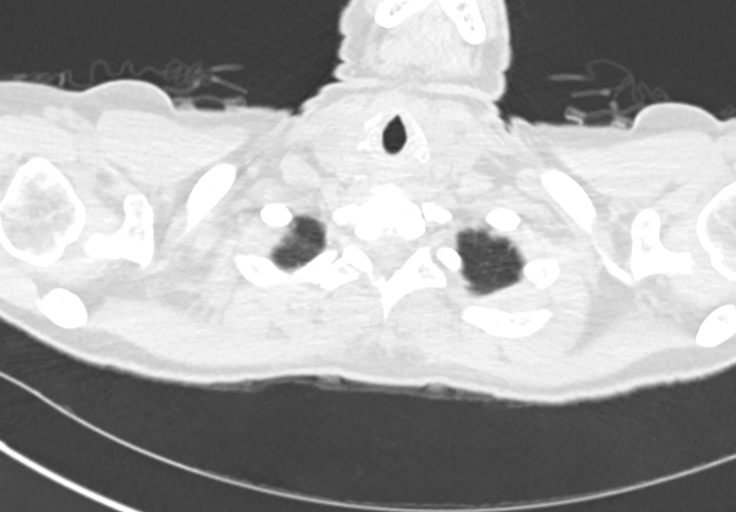

[Series 4: chest 2.00 br40 s3 · coronal · 0.62mm/px · 3 of 110 slices shown (2 of 2)]
[im 22/110  lung]
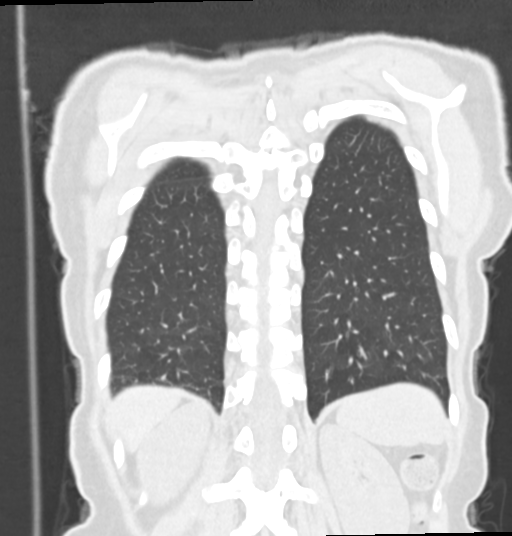
[im 44/110  lung]
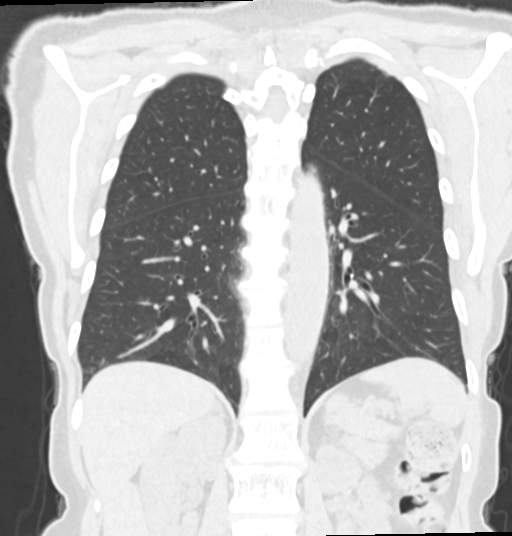
[im 66/110  lung]
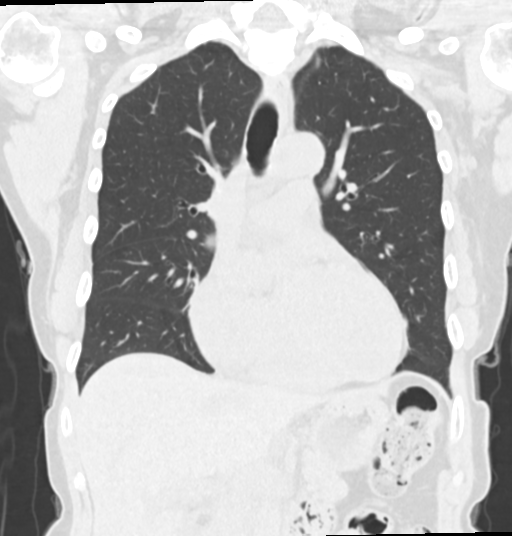

[11 of 36 positions shown; findings below may reference images not displayed]

FINDINGS: Cardiovascular: The heart is normal in size. No pericardial
effusion.

No evidence of thoracic aortic aneurysm.

Mediastinum/Nodes: No suspicious mediastinal lymphadenopathy.

Visualized thyroid is unremarkable.

Lungs/Pleura: Mild scarring/bronchiectasis in the medial right
middle lobe.

No focal consolidation. Mild compressive atelectasis in the medial
right lower lobe.

Mild scarring/atelectasis in the inferior right middle lobe and left
lower lobe.

No suspicious pulmonary nodules.

No pleural effusion or pneumothorax.

Upper Abdomen: Visualized upper abdomen is unremarkable.

Musculoskeletal: Mild degenerative changes of the mid thoracic
spine.
IMPRESSION: Negative CT chest.

## 2021-04-13 IMAGING — MG MM DIGITAL DIAGNOSTIC UNILAT*R* W/ TOMO W/ CAD
6 series · 6 of 18 positions shown · non-contrast
Comparison: Previous exam(s).

CLINICAL DATA: 64-year-old female recalled from screening mammogram
dated 01/08/2020 for possible right breast asymmetries.

EXAM:
DIGITAL DIAGNOSTIC UNILATERAL RIGHT MAMMOGRAM WITH TOMO AND CAD

[R ML synth-2D]
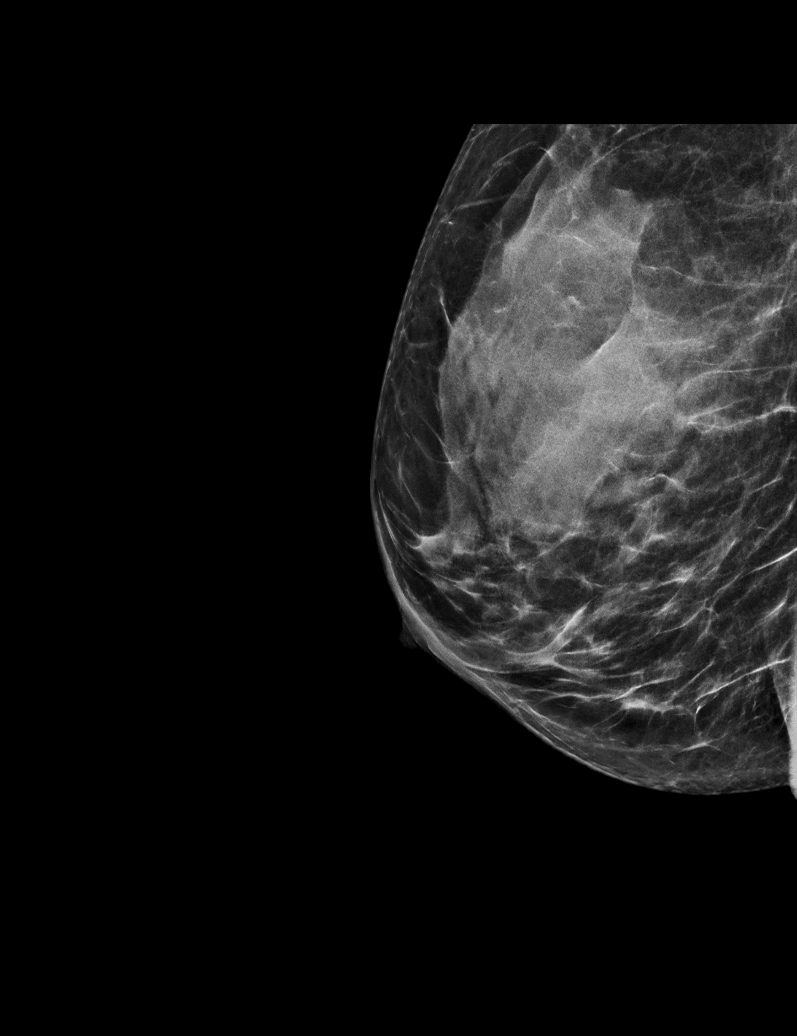

[R CC synth-2D (1 of 2)]
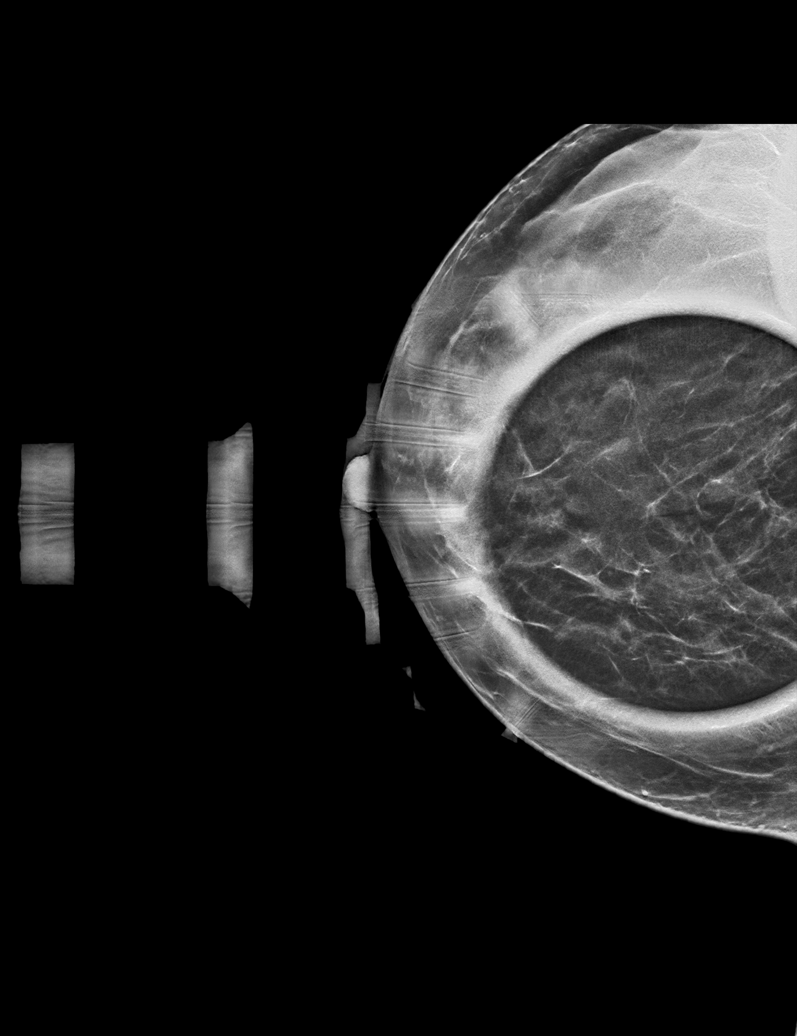

[R CC synth-2D (2 of 2)]
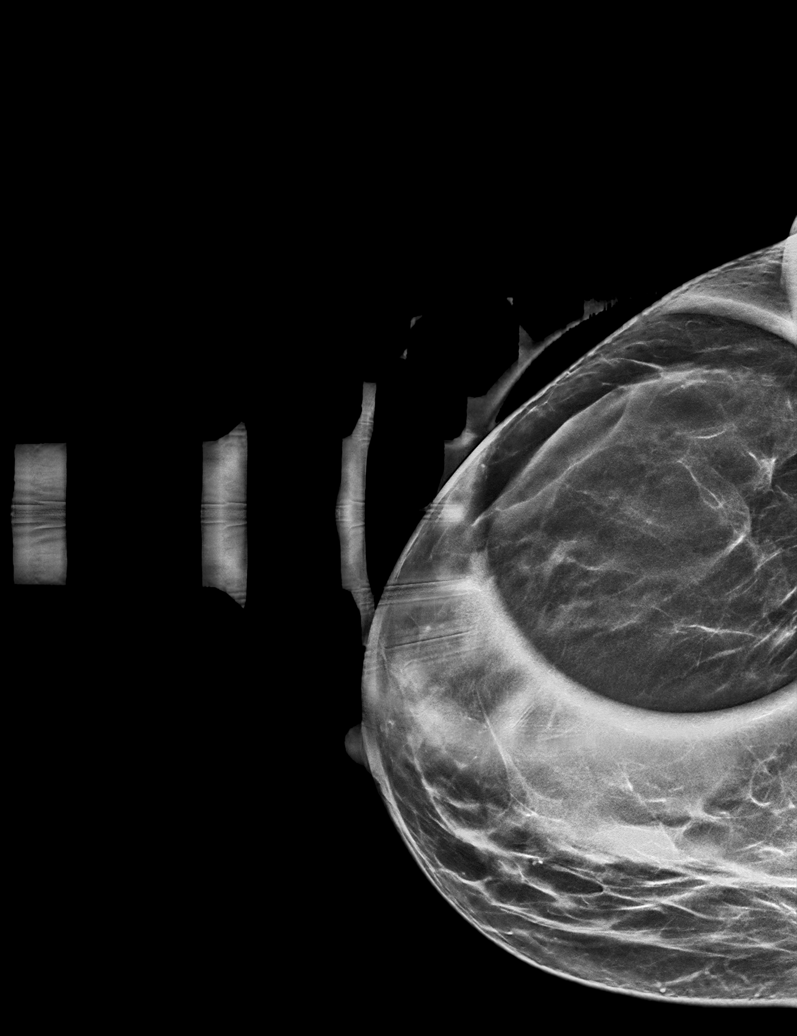

[R CC tomo (1 of 2) · tomo slice 25/50.0]
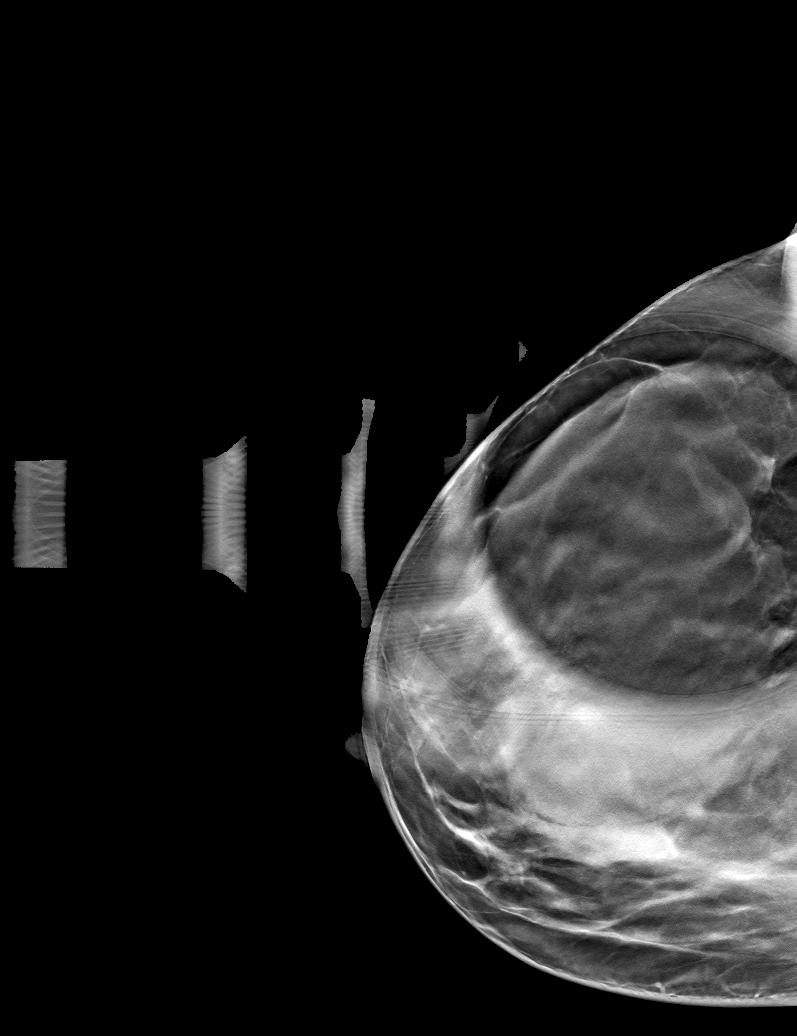

[R ML tomo · tomo slice 27/54.0]
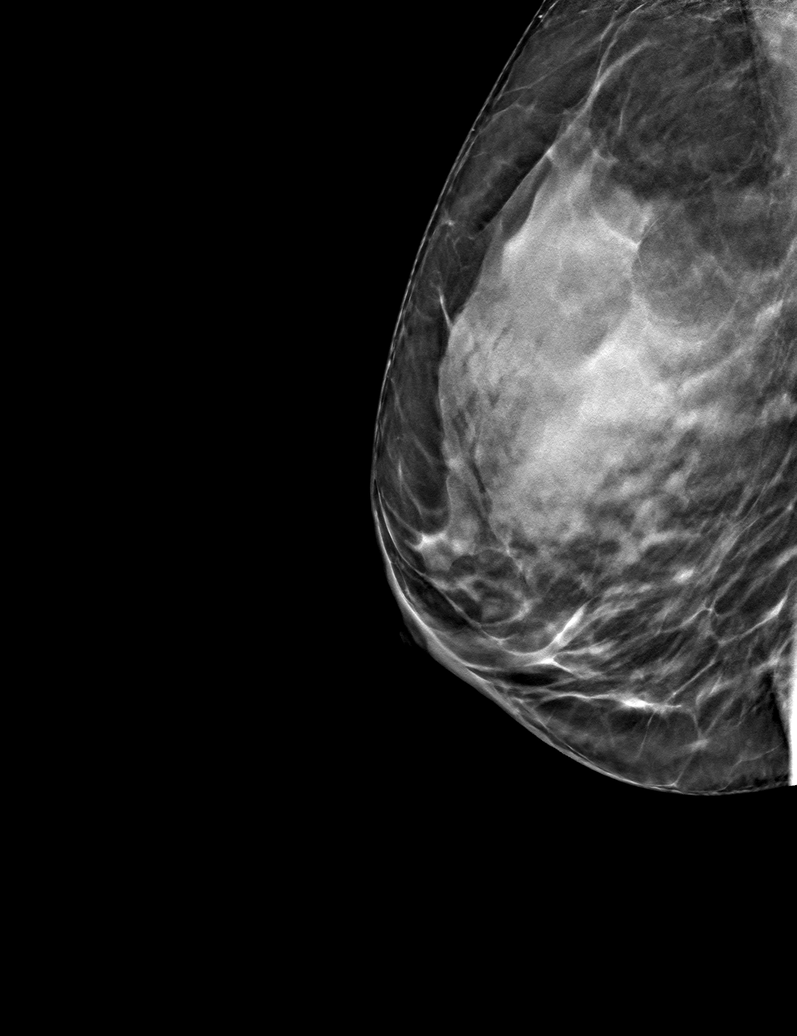

[R CC tomo (2 of 2) · tomo slice 23/45.0]
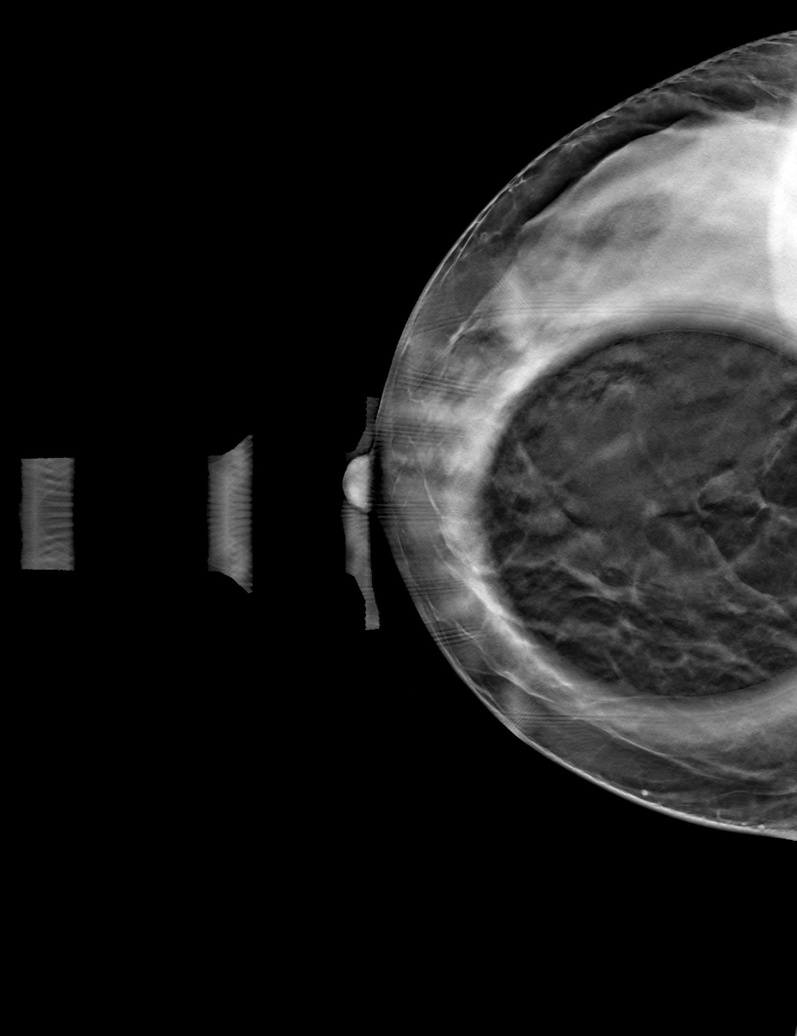

[6 of 18 positions shown; findings below may reference images not displayed]

ACR Breast Density Category d: The breast tissue is extremely dense,
which lowers the sensitivity of mammography.
FINDINGS: Previously described, possible asymmetries in the lateral right
breast at mid depth and medial right breast at posterior depth is
seen on the cc projection only both resolve into well dispersed
fibroglandular tissue on today's additional views. No suspicious
findings are identified.

Mammographic images were processed with CAD.
IMPRESSION: No mammographic evidence of malignancy.

RECOMMENDATION:
Screening mammogram in one year.(Code:V4-B-076)

I have discussed the findings and recommendations with the patient.
If applicable, a reminder letter will be sent to the patient
regarding the next appointment.

BI-RADS CATEGORY  1: Negative.
